# Patient Record
Sex: Female | Born: 1950
Health system: Southern US, Community
[De-identification: ages and names within clinical notes are randomized; demographics above are authoritative.]

## PROBLEM LIST (undated history)

## (undated) DIAGNOSIS — I10 Essential (primary) hypertension: Secondary | ICD-10-CM

## (undated) DIAGNOSIS — R55 Syncope and collapse: Secondary | ICD-10-CM

## (undated) DIAGNOSIS — E7800 Pure hypercholesterolemia, unspecified: Secondary | ICD-10-CM

## (undated) DIAGNOSIS — R9431 Abnormal electrocardiogram [ECG] [EKG]: Secondary | ICD-10-CM

## (undated) HISTORY — PX: TOE SURGERY: SHX1073

## (undated) HISTORY — DX: Essential (primary) hypertension: I10

## (undated) HISTORY — DX: Pure hypercholesterolemia, unspecified: E78.00

## (undated) HISTORY — DX: Syncope and collapse: R55

## (undated) HISTORY — PX: ANKLE SURGERY: SHX546

## (undated) HISTORY — DX: Abnormal electrocardiogram (ECG) (EKG): R94.31

---

## 2018-08-07 DIAGNOSIS — E785 Hyperlipidemia, unspecified: Secondary | ICD-10-CM | POA: Diagnosis not present

## 2018-08-07 DIAGNOSIS — I10 Essential (primary) hypertension: Secondary | ICD-10-CM | POA: Diagnosis not present

## 2018-08-14 DIAGNOSIS — R7301 Impaired fasting glucose: Secondary | ICD-10-CM | POA: Diagnosis not present

## 2018-08-14 DIAGNOSIS — R358 Other polyuria: Secondary | ICD-10-CM | POA: Diagnosis not present

## 2018-08-14 DIAGNOSIS — I10 Essential (primary) hypertension: Secondary | ICD-10-CM | POA: Diagnosis not present

## 2018-08-14 DIAGNOSIS — F329 Major depressive disorder, single episode, unspecified: Secondary | ICD-10-CM | POA: Diagnosis not present

## 2018-08-14 DIAGNOSIS — E785 Hyperlipidemia, unspecified: Secondary | ICD-10-CM | POA: Diagnosis not present

## 2018-12-23 DIAGNOSIS — Z6830 Body mass index (BMI) 30.0-30.9, adult: Secondary | ICD-10-CM | POA: Diagnosis not present

## 2018-12-23 DIAGNOSIS — I10 Essential (primary) hypertension: Secondary | ICD-10-CM | POA: Diagnosis not present

## 2018-12-23 DIAGNOSIS — E785 Hyperlipidemia, unspecified: Secondary | ICD-10-CM | POA: Diagnosis not present

## 2018-12-23 DIAGNOSIS — F329 Major depressive disorder, single episode, unspecified: Secondary | ICD-10-CM | POA: Diagnosis not present

## 2019-01-13 DIAGNOSIS — Z1211 Encounter for screening for malignant neoplasm of colon: Secondary | ICD-10-CM | POA: Diagnosis not present

## 2019-01-13 DIAGNOSIS — G8929 Other chronic pain: Secondary | ICD-10-CM | POA: Diagnosis not present

## 2019-01-13 DIAGNOSIS — M542 Cervicalgia: Secondary | ICD-10-CM | POA: Diagnosis not present

## 2019-01-13 DIAGNOSIS — M47816 Spondylosis without myelopathy or radiculopathy, lumbar region: Secondary | ICD-10-CM | POA: Diagnosis not present

## 2019-01-13 DIAGNOSIS — M549 Dorsalgia, unspecified: Secondary | ICD-10-CM | POA: Diagnosis not present

## 2019-01-13 DIAGNOSIS — M47812 Spondylosis without myelopathy or radiculopathy, cervical region: Secondary | ICD-10-CM | POA: Diagnosis not present

## 2019-01-24 DIAGNOSIS — Z6830 Body mass index (BMI) 30.0-30.9, adult: Secondary | ICD-10-CM | POA: Diagnosis not present

## 2019-01-24 DIAGNOSIS — I6522 Occlusion and stenosis of left carotid artery: Secondary | ICD-10-CM | POA: Diagnosis not present

## 2019-01-24 DIAGNOSIS — M4712 Other spondylosis with myelopathy, cervical region: Secondary | ICD-10-CM | POA: Diagnosis not present

## 2019-02-13 DIAGNOSIS — I708 Atherosclerosis of other arteries: Secondary | ICD-10-CM | POA: Diagnosis not present

## 2019-02-13 DIAGNOSIS — M542 Cervicalgia: Secondary | ICD-10-CM | POA: Diagnosis not present

## 2019-02-13 DIAGNOSIS — I6523 Occlusion and stenosis of bilateral carotid arteries: Secondary | ICD-10-CM | POA: Diagnosis not present

## 2019-02-21 DIAGNOSIS — M47812 Spondylosis without myelopathy or radiculopathy, cervical region: Secondary | ICD-10-CM | POA: Diagnosis not present

## 2019-02-21 DIAGNOSIS — G8929 Other chronic pain: Secondary | ICD-10-CM | POA: Diagnosis not present

## 2019-02-21 DIAGNOSIS — M542 Cervicalgia: Secondary | ICD-10-CM | POA: Diagnosis not present

## 2019-02-21 DIAGNOSIS — M4692 Unspecified inflammatory spondylopathy, cervical region: Secondary | ICD-10-CM | POA: Diagnosis not present

## 2019-02-21 DIAGNOSIS — M4802 Spinal stenosis, cervical region: Secondary | ICD-10-CM | POA: Diagnosis not present

## 2019-03-13 DIAGNOSIS — R51 Headache: Secondary | ICD-10-CM | POA: Diagnosis not present

## 2019-03-13 DIAGNOSIS — M542 Cervicalgia: Secondary | ICD-10-CM | POA: Diagnosis not present

## 2019-04-23 DIAGNOSIS — G8929 Other chronic pain: Secondary | ICD-10-CM | POA: Diagnosis not present

## 2019-04-23 DIAGNOSIS — Z23 Encounter for immunization: Secondary | ICD-10-CM | POA: Diagnosis not present

## 2019-04-23 DIAGNOSIS — M549 Dorsalgia, unspecified: Secondary | ICD-10-CM | POA: Diagnosis not present

## 2019-04-23 DIAGNOSIS — Z6830 Body mass index (BMI) 30.0-30.9, adult: Secondary | ICD-10-CM | POA: Diagnosis not present

## 2019-05-14 DIAGNOSIS — G8929 Other chronic pain: Secondary | ICD-10-CM | POA: Diagnosis not present

## 2019-05-14 DIAGNOSIS — M47812 Spondylosis without myelopathy or radiculopathy, cervical region: Secondary | ICD-10-CM | POA: Diagnosis not present

## 2019-05-14 DIAGNOSIS — M542 Cervicalgia: Secondary | ICD-10-CM | POA: Diagnosis not present

## 2019-07-16 DIAGNOSIS — I10 Essential (primary) hypertension: Secondary | ICD-10-CM | POA: Diagnosis not present

## 2019-07-16 DIAGNOSIS — E785 Hyperlipidemia, unspecified: Secondary | ICD-10-CM | POA: Diagnosis not present

## 2019-07-25 DIAGNOSIS — E785 Hyperlipidemia, unspecified: Secondary | ICD-10-CM | POA: Diagnosis not present

## 2019-07-25 DIAGNOSIS — I10 Essential (primary) hypertension: Secondary | ICD-10-CM | POA: Diagnosis not present

## 2019-07-25 DIAGNOSIS — F329 Major depressive disorder, single episode, unspecified: Secondary | ICD-10-CM | POA: Diagnosis not present

## 2019-07-25 DIAGNOSIS — E669 Obesity, unspecified: Secondary | ICD-10-CM | POA: Diagnosis not present

## 2019-12-30 DIAGNOSIS — Z1339 Encounter for screening examination for other mental health and behavioral disorders: Secondary | ICD-10-CM | POA: Diagnosis not present

## 2019-12-30 DIAGNOSIS — Z0000 Encounter for general adult medical examination without abnormal findings: Secondary | ICD-10-CM | POA: Diagnosis not present

## 2019-12-30 DIAGNOSIS — Z1331 Encounter for screening for depression: Secondary | ICD-10-CM | POA: Diagnosis not present

## 2019-12-30 DIAGNOSIS — I10 Essential (primary) hypertension: Secondary | ICD-10-CM | POA: Diagnosis not present

## 2019-12-30 DIAGNOSIS — Z139 Encounter for screening, unspecified: Secondary | ICD-10-CM | POA: Diagnosis not present

## 2019-12-30 DIAGNOSIS — Z136 Encounter for screening for cardiovascular disorders: Secondary | ICD-10-CM | POA: Diagnosis not present

## 2019-12-30 DIAGNOSIS — E785 Hyperlipidemia, unspecified: Secondary | ICD-10-CM | POA: Diagnosis not present

## 2019-12-31 DIAGNOSIS — Z6831 Body mass index (BMI) 31.0-31.9, adult: Secondary | ICD-10-CM | POA: Diagnosis not present

## 2019-12-31 DIAGNOSIS — M542 Cervicalgia: Secondary | ICD-10-CM | POA: Diagnosis not present

## 2019-12-31 DIAGNOSIS — G8929 Other chronic pain: Secondary | ICD-10-CM | POA: Diagnosis not present

## 2020-01-09 DIAGNOSIS — I10 Essential (primary) hypertension: Secondary | ICD-10-CM | POA: Diagnosis not present

## 2020-01-09 DIAGNOSIS — F329 Major depressive disorder, single episode, unspecified: Secondary | ICD-10-CM | POA: Diagnosis not present

## 2020-01-09 DIAGNOSIS — E785 Hyperlipidemia, unspecified: Secondary | ICD-10-CM | POA: Diagnosis not present

## 2020-01-09 DIAGNOSIS — R7301 Impaired fasting glucose: Secondary | ICD-10-CM | POA: Diagnosis not present

## 2020-03-24 DIAGNOSIS — Z23 Encounter for immunization: Secondary | ICD-10-CM | POA: Diagnosis not present

## 2020-07-14 DIAGNOSIS — E785 Hyperlipidemia, unspecified: Secondary | ICD-10-CM | POA: Diagnosis not present

## 2020-07-14 DIAGNOSIS — R7301 Impaired fasting glucose: Secondary | ICD-10-CM | POA: Diagnosis not present

## 2020-07-21 DIAGNOSIS — Z6830 Body mass index (BMI) 30.0-30.9, adult: Secondary | ICD-10-CM | POA: Diagnosis not present

## 2020-07-21 DIAGNOSIS — I10 Essential (primary) hypertension: Secondary | ICD-10-CM | POA: Diagnosis not present

## 2020-07-21 DIAGNOSIS — E785 Hyperlipidemia, unspecified: Secondary | ICD-10-CM | POA: Diagnosis not present

## 2020-07-21 DIAGNOSIS — R7301 Impaired fasting glucose: Secondary | ICD-10-CM | POA: Diagnosis not present

## 2020-11-03 DIAGNOSIS — R7301 Impaired fasting glucose: Secondary | ICD-10-CM | POA: Diagnosis not present

## 2020-11-03 DIAGNOSIS — E785 Hyperlipidemia, unspecified: Secondary | ICD-10-CM | POA: Diagnosis not present

## 2020-11-10 DIAGNOSIS — R7303 Prediabetes: Secondary | ICD-10-CM | POA: Diagnosis not present

## 2020-11-10 DIAGNOSIS — Z6830 Body mass index (BMI) 30.0-30.9, adult: Secondary | ICD-10-CM | POA: Diagnosis not present

## 2020-11-10 DIAGNOSIS — I10 Essential (primary) hypertension: Secondary | ICD-10-CM | POA: Diagnosis not present

## 2020-11-10 DIAGNOSIS — E785 Hyperlipidemia, unspecified: Secondary | ICD-10-CM | POA: Diagnosis not present

## 2020-12-22 DIAGNOSIS — Z79899 Other long term (current) drug therapy: Secondary | ICD-10-CM | POA: Diagnosis not present

## 2020-12-22 DIAGNOSIS — M150 Primary generalized (osteo)arthritis: Secondary | ICD-10-CM | POA: Diagnosis not present

## 2020-12-22 DIAGNOSIS — E782 Mixed hyperlipidemia: Secondary | ICD-10-CM | POA: Diagnosis not present

## 2020-12-22 DIAGNOSIS — F411 Generalized anxiety disorder: Secondary | ICD-10-CM | POA: Diagnosis not present

## 2020-12-22 DIAGNOSIS — J069 Acute upper respiratory infection, unspecified: Secondary | ICD-10-CM | POA: Diagnosis not present

## 2020-12-22 DIAGNOSIS — F331 Major depressive disorder, recurrent, moderate: Secondary | ICD-10-CM | POA: Diagnosis not present

## 2020-12-22 DIAGNOSIS — I10 Essential (primary) hypertension: Secondary | ICD-10-CM | POA: Diagnosis not present

## 2020-12-23 DIAGNOSIS — M150 Primary generalized (osteo)arthritis: Secondary | ICD-10-CM | POA: Diagnosis not present

## 2020-12-23 DIAGNOSIS — E782 Mixed hyperlipidemia: Secondary | ICD-10-CM | POA: Diagnosis not present

## 2020-12-23 DIAGNOSIS — I10 Essential (primary) hypertension: Secondary | ICD-10-CM | POA: Diagnosis not present

## 2021-01-13 DIAGNOSIS — I10 Essential (primary) hypertension: Secondary | ICD-10-CM | POA: Diagnosis not present

## 2021-01-13 DIAGNOSIS — E782 Mixed hyperlipidemia: Secondary | ICD-10-CM | POA: Diagnosis not present

## 2021-01-13 DIAGNOSIS — M150 Primary generalized (osteo)arthritis: Secondary | ICD-10-CM | POA: Diagnosis not present

## 2021-01-17 DIAGNOSIS — M150 Primary generalized (osteo)arthritis: Secondary | ICD-10-CM | POA: Diagnosis not present

## 2021-01-17 DIAGNOSIS — I10 Essential (primary) hypertension: Secondary | ICD-10-CM | POA: Diagnosis not present

## 2021-01-17 DIAGNOSIS — F411 Generalized anxiety disorder: Secondary | ICD-10-CM | POA: Diagnosis not present

## 2021-02-16 DIAGNOSIS — F411 Generalized anxiety disorder: Secondary | ICD-10-CM | POA: Diagnosis not present

## 2021-02-16 DIAGNOSIS — E1165 Type 2 diabetes mellitus with hyperglycemia: Secondary | ICD-10-CM | POA: Diagnosis not present

## 2021-02-16 DIAGNOSIS — E782 Mixed hyperlipidemia: Secondary | ICD-10-CM | POA: Diagnosis not present

## 2021-02-16 DIAGNOSIS — I10 Essential (primary) hypertension: Secondary | ICD-10-CM | POA: Diagnosis not present

## 2021-02-16 DIAGNOSIS — R011 Cardiac murmur, unspecified: Secondary | ICD-10-CM | POA: Diagnosis not present

## 2021-02-16 DIAGNOSIS — Z79899 Other long term (current) drug therapy: Secondary | ICD-10-CM | POA: Diagnosis not present

## 2021-02-16 DIAGNOSIS — D518 Other vitamin B12 deficiency anemias: Secondary | ICD-10-CM | POA: Diagnosis not present

## 2021-02-16 DIAGNOSIS — E559 Vitamin D deficiency, unspecified: Secondary | ICD-10-CM | POA: Diagnosis not present

## 2021-02-16 DIAGNOSIS — M150 Primary generalized (osteo)arthritis: Secondary | ICD-10-CM | POA: Diagnosis not present

## 2021-02-16 DIAGNOSIS — Z0000 Encounter for general adult medical examination without abnormal findings: Secondary | ICD-10-CM | POA: Diagnosis not present

## 2021-02-16 DIAGNOSIS — E038 Other specified hypothyroidism: Secondary | ICD-10-CM | POA: Diagnosis not present

## 2021-02-18 DIAGNOSIS — I6523 Occlusion and stenosis of bilateral carotid arteries: Secondary | ICD-10-CM | POA: Diagnosis not present

## 2021-02-18 DIAGNOSIS — R0989 Other specified symptoms and signs involving the circulatory and respiratory systems: Secondary | ICD-10-CM | POA: Diagnosis not present

## 2021-02-22 DIAGNOSIS — E1165 Type 2 diabetes mellitus with hyperglycemia: Secondary | ICD-10-CM | POA: Diagnosis not present

## 2021-02-22 DIAGNOSIS — M150 Primary generalized (osteo)arthritis: Secondary | ICD-10-CM | POA: Diagnosis not present

## 2021-02-22 DIAGNOSIS — I10 Essential (primary) hypertension: Secondary | ICD-10-CM | POA: Diagnosis not present

## 2021-02-22 DIAGNOSIS — E782 Mixed hyperlipidemia: Secondary | ICD-10-CM | POA: Diagnosis not present

## 2021-02-22 DIAGNOSIS — D518 Other vitamin B12 deficiency anemias: Secondary | ICD-10-CM | POA: Diagnosis not present

## 2021-02-22 DIAGNOSIS — E559 Vitamin D deficiency, unspecified: Secondary | ICD-10-CM | POA: Diagnosis not present

## 2021-02-22 DIAGNOSIS — E038 Other specified hypothyroidism: Secondary | ICD-10-CM | POA: Diagnosis not present

## 2021-02-24 DIAGNOSIS — R590 Localized enlarged lymph nodes: Secondary | ICD-10-CM | POA: Diagnosis not present

## 2021-02-25 DIAGNOSIS — R229 Localized swelling, mass and lump, unspecified: Secondary | ICD-10-CM | POA: Diagnosis not present

## 2021-02-25 DIAGNOSIS — R2232 Localized swelling, mass and lump, left upper limb: Secondary | ICD-10-CM | POA: Diagnosis not present

## 2021-02-25 DIAGNOSIS — D492 Neoplasm of unspecified behavior of bone, soft tissue, and skin: Secondary | ICD-10-CM | POA: Diagnosis not present

## 2021-02-25 DIAGNOSIS — M79642 Pain in left hand: Secondary | ICD-10-CM | POA: Diagnosis not present

## 2021-03-01 DIAGNOSIS — R1310 Dysphagia, unspecified: Secondary | ICD-10-CM | POA: Diagnosis not present

## 2021-03-01 DIAGNOSIS — R221 Localized swelling, mass and lump, neck: Secondary | ICD-10-CM | POA: Diagnosis not present

## 2021-03-04 DIAGNOSIS — I77811 Abdominal aortic ectasia: Secondary | ICD-10-CM | POA: Diagnosis not present

## 2021-03-07 DIAGNOSIS — M150 Primary generalized (osteo)arthritis: Secondary | ICD-10-CM | POA: Diagnosis not present

## 2021-03-08 DIAGNOSIS — D518 Other vitamin B12 deficiency anemias: Secondary | ICD-10-CM | POA: Diagnosis not present

## 2021-03-08 DIAGNOSIS — M542 Cervicalgia: Secondary | ICD-10-CM | POA: Diagnosis not present

## 2021-03-16 DIAGNOSIS — M47812 Spondylosis without myelopathy or radiculopathy, cervical region: Secondary | ICD-10-CM | POA: Diagnosis not present

## 2021-03-16 DIAGNOSIS — M50221 Other cervical disc displacement at C4-C5 level: Secondary | ICD-10-CM | POA: Diagnosis not present

## 2021-03-16 DIAGNOSIS — M4803 Spinal stenosis, cervicothoracic region: Secondary | ICD-10-CM | POA: Diagnosis not present

## 2021-03-16 DIAGNOSIS — M542 Cervicalgia: Secondary | ICD-10-CM | POA: Diagnosis not present

## 2021-03-16 DIAGNOSIS — M50223 Other cervical disc displacement at C6-C7 level: Secondary | ICD-10-CM | POA: Diagnosis not present

## 2021-03-16 DIAGNOSIS — M4802 Spinal stenosis, cervical region: Secondary | ICD-10-CM | POA: Diagnosis not present

## 2021-03-16 DIAGNOSIS — S22009A Unspecified fracture of unspecified thoracic vertebra, initial encounter for closed fracture: Secondary | ICD-10-CM | POA: Diagnosis not present

## 2021-03-18 DIAGNOSIS — E782 Mixed hyperlipidemia: Secondary | ICD-10-CM | POA: Diagnosis not present

## 2021-03-18 DIAGNOSIS — M150 Primary generalized (osteo)arthritis: Secondary | ICD-10-CM | POA: Diagnosis not present

## 2021-03-18 DIAGNOSIS — I10 Essential (primary) hypertension: Secondary | ICD-10-CM | POA: Diagnosis not present

## 2021-03-18 DIAGNOSIS — E038 Other specified hypothyroidism: Secondary | ICD-10-CM | POA: Diagnosis not present

## 2021-03-18 DIAGNOSIS — E559 Vitamin D deficiency, unspecified: Secondary | ICD-10-CM | POA: Diagnosis not present

## 2021-03-18 DIAGNOSIS — E1165 Type 2 diabetes mellitus with hyperglycemia: Secondary | ICD-10-CM | POA: Diagnosis not present

## 2021-03-18 DIAGNOSIS — D518 Other vitamin B12 deficiency anemias: Secondary | ICD-10-CM | POA: Diagnosis not present

## 2021-03-25 DIAGNOSIS — M150 Primary generalized (osteo)arthritis: Secondary | ICD-10-CM | POA: Diagnosis not present

## 2021-03-30 DIAGNOSIS — M150 Primary generalized (osteo)arthritis: Secondary | ICD-10-CM | POA: Diagnosis not present

## 2021-03-30 DIAGNOSIS — E1165 Type 2 diabetes mellitus with hyperglycemia: Secondary | ICD-10-CM | POA: Diagnosis not present

## 2021-03-30 DIAGNOSIS — E782 Mixed hyperlipidemia: Secondary | ICD-10-CM | POA: Diagnosis not present

## 2021-03-30 DIAGNOSIS — I10 Essential (primary) hypertension: Secondary | ICD-10-CM | POA: Diagnosis not present

## 2021-03-30 DIAGNOSIS — E038 Other specified hypothyroidism: Secondary | ICD-10-CM | POA: Diagnosis not present

## 2021-03-30 DIAGNOSIS — Z23 Encounter for immunization: Secondary | ICD-10-CM | POA: Diagnosis not present

## 2021-04-19 DIAGNOSIS — M542 Cervicalgia: Secondary | ICD-10-CM | POA: Diagnosis not present

## 2021-04-25 DIAGNOSIS — F331 Major depressive disorder, recurrent, moderate: Secondary | ICD-10-CM | POA: Diagnosis not present

## 2021-04-25 DIAGNOSIS — E038 Other specified hypothyroidism: Secondary | ICD-10-CM | POA: Diagnosis not present

## 2021-04-25 DIAGNOSIS — E1165 Type 2 diabetes mellitus with hyperglycemia: Secondary | ICD-10-CM | POA: Diagnosis not present

## 2021-04-25 DIAGNOSIS — M150 Primary generalized (osteo)arthritis: Secondary | ICD-10-CM | POA: Diagnosis not present

## 2021-04-25 DIAGNOSIS — E559 Vitamin D deficiency, unspecified: Secondary | ICD-10-CM | POA: Diagnosis not present

## 2021-04-25 DIAGNOSIS — I10 Essential (primary) hypertension: Secondary | ICD-10-CM | POA: Diagnosis not present

## 2021-04-25 DIAGNOSIS — D518 Other vitamin B12 deficiency anemias: Secondary | ICD-10-CM | POA: Diagnosis not present

## 2021-04-25 DIAGNOSIS — E782 Mixed hyperlipidemia: Secondary | ICD-10-CM | POA: Diagnosis not present

## 2021-04-27 DIAGNOSIS — D518 Other vitamin B12 deficiency anemias: Secondary | ICD-10-CM | POA: Diagnosis not present

## 2021-05-03 DIAGNOSIS — M542 Cervicalgia: Secondary | ICD-10-CM | POA: Diagnosis not present

## 2021-05-11 DIAGNOSIS — D518 Other vitamin B12 deficiency anemias: Secondary | ICD-10-CM | POA: Diagnosis not present

## 2021-05-11 DIAGNOSIS — I10 Essential (primary) hypertension: Secondary | ICD-10-CM | POA: Diagnosis not present

## 2021-05-11 DIAGNOSIS — E038 Other specified hypothyroidism: Secondary | ICD-10-CM | POA: Diagnosis not present

## 2021-05-11 DIAGNOSIS — Z79899 Other long term (current) drug therapy: Secondary | ICD-10-CM | POA: Diagnosis not present

## 2021-05-11 DIAGNOSIS — E559 Vitamin D deficiency, unspecified: Secondary | ICD-10-CM | POA: Diagnosis not present

## 2021-05-11 DIAGNOSIS — E782 Mixed hyperlipidemia: Secondary | ICD-10-CM | POA: Diagnosis not present

## 2021-05-11 DIAGNOSIS — F331 Major depressive disorder, recurrent, moderate: Secondary | ICD-10-CM | POA: Diagnosis not present

## 2021-05-11 DIAGNOSIS — E1165 Type 2 diabetes mellitus with hyperglycemia: Secondary | ICD-10-CM | POA: Diagnosis not present

## 2021-05-11 DIAGNOSIS — M150 Primary generalized (osteo)arthritis: Secondary | ICD-10-CM | POA: Diagnosis not present

## 2021-05-23 DIAGNOSIS — E559 Vitamin D deficiency, unspecified: Secondary | ICD-10-CM | POA: Diagnosis not present

## 2021-05-23 DIAGNOSIS — D518 Other vitamin B12 deficiency anemias: Secondary | ICD-10-CM | POA: Diagnosis not present

## 2021-05-23 DIAGNOSIS — E782 Mixed hyperlipidemia: Secondary | ICD-10-CM | POA: Diagnosis not present

## 2021-05-24 DIAGNOSIS — F331 Major depressive disorder, recurrent, moderate: Secondary | ICD-10-CM | POA: Diagnosis not present

## 2021-05-25 DIAGNOSIS — J449 Chronic obstructive pulmonary disease, unspecified: Secondary | ICD-10-CM | POA: Diagnosis not present

## 2021-05-25 DIAGNOSIS — F331 Major depressive disorder, recurrent, moderate: Secondary | ICD-10-CM | POA: Diagnosis not present

## 2021-05-25 DIAGNOSIS — E1165 Type 2 diabetes mellitus with hyperglycemia: Secondary | ICD-10-CM | POA: Diagnosis not present

## 2021-05-25 DIAGNOSIS — E038 Other specified hypothyroidism: Secondary | ICD-10-CM | POA: Diagnosis not present

## 2021-05-25 DIAGNOSIS — E782 Mixed hyperlipidemia: Secondary | ICD-10-CM | POA: Diagnosis not present

## 2021-05-25 DIAGNOSIS — M150 Primary generalized (osteo)arthritis: Secondary | ICD-10-CM | POA: Diagnosis not present

## 2021-05-25 DIAGNOSIS — I10 Essential (primary) hypertension: Secondary | ICD-10-CM | POA: Diagnosis not present

## 2021-05-25 DIAGNOSIS — D518 Other vitamin B12 deficiency anemias: Secondary | ICD-10-CM | POA: Diagnosis not present

## 2021-05-25 DIAGNOSIS — E559 Vitamin D deficiency, unspecified: Secondary | ICD-10-CM | POA: Diagnosis not present

## 2021-09-13 ENCOUNTER — Other Ambulatory Visit: Payer: Self-pay | Admitting: Podiatry

## 2021-09-13 DIAGNOSIS — R2242 Localized swelling, mass and lump, left lower limb: Secondary | ICD-10-CM

## 2021-09-13 DIAGNOSIS — M79672 Pain in left foot: Secondary | ICD-10-CM

## 2021-10-18 ENCOUNTER — Other Ambulatory Visit: Payer: Self-pay

## 2021-11-17 ENCOUNTER — Other Ambulatory Visit: Payer: Self-pay

## 2021-11-23 ENCOUNTER — Ambulatory Visit
Admission: RE | Admit: 2021-11-23 | Discharge: 2021-11-23 | Disposition: A | Discharge status: Home / Self Care | Payer: Medicare HMO | Source: Ambulatory Visit | Attending: Podiatry | Admitting: Podiatry

## 2021-11-23 DIAGNOSIS — M79672 Pain in left foot: Secondary | ICD-10-CM

## 2021-11-23 DIAGNOSIS — R2242 Localized swelling, mass and lump, left lower limb: Secondary | ICD-10-CM

## 2021-11-23 MED ORDER — GADOBENATE DIMEGLUMINE 529 MG/ML IV SOLN
13.0000 mL | Freq: Once | INTRAVENOUS | Status: AC | PRN
  Administered 2021-11-23: 13 mL via INTRAVENOUS

## 2022-06-21 DIAGNOSIS — D518 Other vitamin B12 deficiency anemias: Secondary | ICD-10-CM | POA: Diagnosis not present

## 2022-06-21 DIAGNOSIS — E038 Other specified hypothyroidism: Secondary | ICD-10-CM | POA: Diagnosis not present

## 2022-06-21 DIAGNOSIS — I10 Essential (primary) hypertension: Secondary | ICD-10-CM | POA: Diagnosis not present

## 2022-06-21 DIAGNOSIS — J449 Chronic obstructive pulmonary disease, unspecified: Secondary | ICD-10-CM | POA: Diagnosis not present

## 2022-06-21 DIAGNOSIS — E782 Mixed hyperlipidemia: Secondary | ICD-10-CM | POA: Diagnosis not present

## 2022-06-21 DIAGNOSIS — M150 Primary generalized (osteo)arthritis: Secondary | ICD-10-CM | POA: Diagnosis not present

## 2022-06-21 DIAGNOSIS — E559 Vitamin D deficiency, unspecified: Secondary | ICD-10-CM | POA: Diagnosis not present

## 2022-06-21 DIAGNOSIS — F331 Major depressive disorder, recurrent, moderate: Secondary | ICD-10-CM | POA: Diagnosis not present

## 2022-06-21 DIAGNOSIS — E1165 Type 2 diabetes mellitus with hyperglycemia: Secondary | ICD-10-CM | POA: Diagnosis not present

## 2022-06-24 DIAGNOSIS — I10 Essential (primary) hypertension: Secondary | ICD-10-CM | POA: Diagnosis not present

## 2022-07-11 DIAGNOSIS — D518 Other vitamin B12 deficiency anemias: Secondary | ICD-10-CM | POA: Diagnosis not present

## 2022-07-11 DIAGNOSIS — E038 Other specified hypothyroidism: Secondary | ICD-10-CM | POA: Diagnosis not present

## 2022-07-11 DIAGNOSIS — J449 Chronic obstructive pulmonary disease, unspecified: Secondary | ICD-10-CM | POA: Diagnosis not present

## 2022-07-11 DIAGNOSIS — E559 Vitamin D deficiency, unspecified: Secondary | ICD-10-CM | POA: Diagnosis not present

## 2022-07-11 DIAGNOSIS — M150 Primary generalized (osteo)arthritis: Secondary | ICD-10-CM | POA: Diagnosis not present

## 2022-07-11 DIAGNOSIS — E1165 Type 2 diabetes mellitus with hyperglycemia: Secondary | ICD-10-CM | POA: Diagnosis not present

## 2022-07-11 DIAGNOSIS — E782 Mixed hyperlipidemia: Secondary | ICD-10-CM | POA: Diagnosis not present

## 2022-07-11 DIAGNOSIS — I10 Essential (primary) hypertension: Secondary | ICD-10-CM | POA: Diagnosis not present

## 2022-07-11 DIAGNOSIS — F331 Major depressive disorder, recurrent, moderate: Secondary | ICD-10-CM | POA: Diagnosis not present

## 2022-07-17 DIAGNOSIS — U071 COVID-19: Secondary | ICD-10-CM | POA: Diagnosis not present

## 2022-07-17 DIAGNOSIS — Z20828 Contact with and (suspected) exposure to other viral communicable diseases: Secondary | ICD-10-CM | POA: Diagnosis not present

## 2022-07-17 DIAGNOSIS — J069 Acute upper respiratory infection, unspecified: Secondary | ICD-10-CM | POA: Diagnosis not present

## 2022-07-18 DIAGNOSIS — E7800 Pure hypercholesterolemia, unspecified: Secondary | ICD-10-CM | POA: Diagnosis not present

## 2022-07-18 DIAGNOSIS — R059 Cough, unspecified: Secondary | ICD-10-CM | POA: Diagnosis not present

## 2022-07-18 DIAGNOSIS — R000 Tachycardia, unspecified: Secondary | ICD-10-CM | POA: Diagnosis not present

## 2022-07-18 DIAGNOSIS — J205 Acute bronchitis due to respiratory syncytial virus: Secondary | ICD-10-CM | POA: Diagnosis not present

## 2022-07-18 DIAGNOSIS — R9431 Abnormal electrocardiogram [ECG] [EKG]: Secondary | ICD-10-CM | POA: Diagnosis not present

## 2022-07-18 DIAGNOSIS — I10 Essential (primary) hypertension: Secondary | ICD-10-CM | POA: Diagnosis not present

## 2022-07-25 DIAGNOSIS — I10 Essential (primary) hypertension: Secondary | ICD-10-CM | POA: Diagnosis not present

## 2022-07-29 DIAGNOSIS — R1110 Vomiting, unspecified: Secondary | ICD-10-CM | POA: Diagnosis not present

## 2022-07-29 DIAGNOSIS — R5383 Other fatigue: Secondary | ICD-10-CM | POA: Diagnosis not present

## 2022-07-29 DIAGNOSIS — R531 Weakness: Secondary | ICD-10-CM | POA: Diagnosis not present

## 2022-07-29 DIAGNOSIS — R55 Syncope and collapse: Secondary | ICD-10-CM | POA: Diagnosis not present

## 2022-07-29 DIAGNOSIS — Z20822 Contact with and (suspected) exposure to covid-19: Secondary | ICD-10-CM | POA: Diagnosis not present

## 2022-07-29 DIAGNOSIS — F17210 Nicotine dependence, cigarettes, uncomplicated: Secondary | ICD-10-CM | POA: Diagnosis not present

## 2022-07-29 DIAGNOSIS — R630 Anorexia: Secondary | ICD-10-CM | POA: Diagnosis not present

## 2022-07-29 DIAGNOSIS — R9431 Abnormal electrocardiogram [ECG] [EKG]: Secondary | ICD-10-CM | POA: Diagnosis not present

## 2022-07-29 DIAGNOSIS — R112 Nausea with vomiting, unspecified: Secondary | ICD-10-CM | POA: Diagnosis not present

## 2022-07-29 DIAGNOSIS — Z79899 Other long term (current) drug therapy: Secondary | ICD-10-CM | POA: Diagnosis not present

## 2022-07-30 DIAGNOSIS — R9431 Abnormal electrocardiogram [ECG] [EKG]: Secondary | ICD-10-CM | POA: Diagnosis not present

## 2022-07-30 DIAGNOSIS — I499 Cardiac arrhythmia, unspecified: Secondary | ICD-10-CM | POA: Diagnosis not present

## 2022-07-30 DIAGNOSIS — R55 Syncope and collapse: Secondary | ICD-10-CM | POA: Diagnosis not present

## 2022-07-30 DIAGNOSIS — I3139 Other pericardial effusion (noninflammatory): Secondary | ICD-10-CM | POA: Diagnosis not present

## 2022-07-30 DIAGNOSIS — R112 Nausea with vomiting, unspecified: Secondary | ICD-10-CM | POA: Diagnosis not present

## 2022-08-02 DIAGNOSIS — Z79899 Other long term (current) drug therapy: Secondary | ICD-10-CM | POA: Diagnosis not present

## 2022-08-02 DIAGNOSIS — E038 Other specified hypothyroidism: Secondary | ICD-10-CM | POA: Diagnosis not present

## 2022-08-02 DIAGNOSIS — E559 Vitamin D deficiency, unspecified: Secondary | ICD-10-CM | POA: Diagnosis not present

## 2022-08-02 DIAGNOSIS — D518 Other vitamin B12 deficiency anemias: Secondary | ICD-10-CM | POA: Diagnosis not present

## 2022-08-02 DIAGNOSIS — E782 Mixed hyperlipidemia: Secondary | ICD-10-CM | POA: Diagnosis not present

## 2022-08-02 DIAGNOSIS — F331 Major depressive disorder, recurrent, moderate: Secondary | ICD-10-CM | POA: Diagnosis not present

## 2022-08-02 DIAGNOSIS — I10 Essential (primary) hypertension: Secondary | ICD-10-CM | POA: Diagnosis not present

## 2022-08-02 DIAGNOSIS — E1165 Type 2 diabetes mellitus with hyperglycemia: Secondary | ICD-10-CM | POA: Diagnosis not present

## 2022-08-02 DIAGNOSIS — M150 Primary generalized (osteo)arthritis: Secondary | ICD-10-CM | POA: Diagnosis not present

## 2022-08-08 NOTE — Progress Notes (Unsigned)
  Cardiology Office Note:    Date:  08/08/2022   ID:  Brenda Henry, DOB 1950/09/20, MRN [id number]  PCP:  Bonnita Nasuti, MD  Cardiologist:  Shirlee More, MD   Referring MD: Ronita Hipps, MD  ASSESSMENT:    No diagnosis found. PLAN:    In order of problems listed above:  ***  Next appointment   Medication Adjustments/Labs and Tests Ordered: Current medicines are reviewed at length with the patient today.  Concerns regarding medicines are outlined above.  No orders of the defined types were placed in this encounter.  No orders of the defined types were placed in this encounter.    No chief complaint on file. ***  History of Present Illness:    Brenda Henry is a 72 y.o. female who is being seen today for the evaluation of *** at the request of Ronita Hipps, MD.   No past medical history on file.  *** The histories are not reviewed yet. Please review them in the "History" navigator section and refresh this [place].  Current Medications: No outpatient medications have been marked as taking for the 08/09/22 encounter (Appointment) with Richardo Priest, MD.     Allergies:   Patient has no allergy information on record.   Social History   Socioeconomic History   Marital status: Single    Spouse name: Not on file   Number of children: Not on file   Years of education: Not on file   Highest education level: Not on file  Occupational History   Not on file  Tobacco Use   Smoking status: Not on file   Smokeless tobacco: Not on file  Substance and Sexual Activity   Alcohol use: Not on file   Drug use: Not on file   Sexual activity: Not on file  Other Topics Concern   Not on file  Social History Narrative   Not on file   Social Determinants of Health   Financial Resource Strain: Not on file  Food Insecurity: Not on file  Transportation Needs: Not on file  Physical Activity: Not on file  Stress: Not on file  Social Connections: Not on file      Family History: The patient's ***family history is not on file.  ROS:   ROS Please see the history of present illness.    *** All other systems reviewed and are negative.  EKGs/Labs/Other Studies Reviewed:    The following studies were reviewed today: ***  EKG:  EKG is *** ordered today.  The ekg ordered today is personally reviewed and demonstrates ***  Recent Labs: No results found for requested labs within last 365 days.  Recent Lipid Panel No results found for: "CHOL", "TRIG", "HDL", "CHOLHDL", "VLDL", "LDLCALC", "LDLDIRECT"  Physical Exam:    VS:  There were no vitals taken for this visit.    Wt Readings from Last 3 Encounters:  No data found for Wt     GEN: *** Well nourished, well developed in no acute distress HEENT: Normal NECK: No JVD; No carotid bruits LYMPHATICS: No lymphadenopathy CARDIAC: ***RRR, no murmurs, rubs, gallops RESPIRATORY:  Clear to auscultation without rales, wheezing or rhonchi  ABDOMEN: Soft, non-tender, non-distended MUSCULOSKELETAL:  No edema; No deformity  SKIN: Warm and dry NEUROLOGIC:  Alert and oriented x 3 PSYCHIATRIC:  Normal affect     Signed, Shirlee More, MD  08/08/2022 11:04 AM    [place]

## 2022-08-09 ENCOUNTER — Encounter: Payer: Self-pay | Admitting: Cardiology

## 2022-08-09 ENCOUNTER — Ambulatory Visit: Payer: Medicare HMO | Attending: Cardiology | Admitting: Cardiology

## 2022-08-09 VITALS — BP 134/84 | HR 115 | Ht 60.0 in | Wt 140.0 lb

## 2022-08-09 DIAGNOSIS — R9431 Abnormal electrocardiogram [ECG] [EKG]: Secondary | ICD-10-CM

## 2022-08-09 DIAGNOSIS — I10 Essential (primary) hypertension: Secondary | ICD-10-CM | POA: Diagnosis not present

## 2022-08-09 DIAGNOSIS — E7800 Pure hypercholesterolemia, unspecified: Secondary | ICD-10-CM

## 2022-08-09 DIAGNOSIS — R55 Syncope and collapse: Secondary | ICD-10-CM | POA: Insufficient documentation

## 2022-08-09 NOTE — Patient Instructions (Signed)
Medication Instructions:  Your physician recommends that you continue on your current medications as directed. Please refer to the Current Medication list given to you today.  *If you need a refill on your cardiac medications before your next appointment, please call your pharmacy*   Lab Work: None If you have labs (blood work) drawn today and your tests are completely normal, you will receive your results only by: [place] (if you have MyChart) OR A paper copy in the mail If you have any lab test that is abnormal or we need to change your treatment, we will call you to review the results.   Testing/Procedures: None   Follow-Up: At [facility], you and your health needs are our priority.  As part of our continuing mission to provide you with exceptional heart care, we have created designated Provider Care Teams.  These Care Teams include your primary Cardiologist (physician) and Advanced Practice Providers (APPs -  Physician Assistants and Nurse Practitioners) who all work together to provide you with the care you need, when you need it.  We recommend signing up for the patient portal called "MyChart".  Sign up information is provided on this After Visit Summary.  MyChart is used to connect with patients for Virtual Visits (Telemedicine).  Patients are able to view lab/test results, encounter notes, upcoming appointments, etc.  Non-urgent messages can be sent to your provider as well.   To learn more about what you can do with MyChart, go to [email or web address].    Your next appointment:   Follow up as needed  Provider:   Shirlee More, MD    Other Instructions None  This visit was accompanied by Edwyna Shell

## 2022-08-23 DIAGNOSIS — E782 Mixed hyperlipidemia: Secondary | ICD-10-CM | POA: Diagnosis not present

## 2022-08-23 DIAGNOSIS — M150 Primary generalized (osteo)arthritis: Secondary | ICD-10-CM | POA: Diagnosis not present

## 2022-08-23 DIAGNOSIS — E1165 Type 2 diabetes mellitus with hyperglycemia: Secondary | ICD-10-CM | POA: Diagnosis not present

## 2022-08-23 DIAGNOSIS — I10 Essential (primary) hypertension: Secondary | ICD-10-CM | POA: Diagnosis not present

## 2022-08-23 DIAGNOSIS — F331 Major depressive disorder, recurrent, moderate: Secondary | ICD-10-CM | POA: Diagnosis not present

## 2022-08-23 DIAGNOSIS — E038 Other specified hypothyroidism: Secondary | ICD-10-CM | POA: Diagnosis not present

## 2022-08-23 DIAGNOSIS — D518 Other vitamin B12 deficiency anemias: Secondary | ICD-10-CM | POA: Diagnosis not present

## 2022-08-23 DIAGNOSIS — J449 Chronic obstructive pulmonary disease, unspecified: Secondary | ICD-10-CM | POA: Diagnosis not present

## 2022-08-23 DIAGNOSIS — E559 Vitamin D deficiency, unspecified: Secondary | ICD-10-CM | POA: Diagnosis not present

## 2022-08-31 DIAGNOSIS — I10 Essential (primary) hypertension: Secondary | ICD-10-CM | POA: Diagnosis not present

## 2022-08-31 DIAGNOSIS — F331 Major depressive disorder, recurrent, moderate: Secondary | ICD-10-CM | POA: Diagnosis not present

## 2022-08-31 DIAGNOSIS — D518 Other vitamin B12 deficiency anemias: Secondary | ICD-10-CM | POA: Diagnosis not present

## 2022-08-31 DIAGNOSIS — E559 Vitamin D deficiency, unspecified: Secondary | ICD-10-CM | POA: Diagnosis not present

## 2022-08-31 DIAGNOSIS — E038 Other specified hypothyroidism: Secondary | ICD-10-CM | POA: Diagnosis not present

## 2022-08-31 DIAGNOSIS — F411 Generalized anxiety disorder: Secondary | ICD-10-CM | POA: Diagnosis not present

## 2022-08-31 DIAGNOSIS — M150 Primary generalized (osteo)arthritis: Secondary | ICD-10-CM | POA: Diagnosis not present

## 2022-08-31 DIAGNOSIS — E1165 Type 2 diabetes mellitus with hyperglycemia: Secondary | ICD-10-CM | POA: Diagnosis not present

## 2022-08-31 DIAGNOSIS — E782 Mixed hyperlipidemia: Secondary | ICD-10-CM | POA: Diagnosis not present

## 2022-09-19 DIAGNOSIS — F419 Anxiety disorder, unspecified: Secondary | ICD-10-CM | POA: Diagnosis not present

## 2022-09-23 DIAGNOSIS — I10 Essential (primary) hypertension: Secondary | ICD-10-CM | POA: Diagnosis not present

## 2022-09-28 DIAGNOSIS — E559 Vitamin D deficiency, unspecified: Secondary | ICD-10-CM | POA: Diagnosis not present

## 2022-09-28 DIAGNOSIS — E782 Mixed hyperlipidemia: Secondary | ICD-10-CM | POA: Diagnosis not present

## 2022-09-28 DIAGNOSIS — F411 Generalized anxiety disorder: Secondary | ICD-10-CM | POA: Diagnosis not present

## 2022-09-28 DIAGNOSIS — M150 Primary generalized (osteo)arthritis: Secondary | ICD-10-CM | POA: Diagnosis not present

## 2022-09-28 DIAGNOSIS — D518 Other vitamin B12 deficiency anemias: Secondary | ICD-10-CM | POA: Diagnosis not present

## 2022-09-28 DIAGNOSIS — F331 Major depressive disorder, recurrent, moderate: Secondary | ICD-10-CM | POA: Diagnosis not present

## 2022-09-28 DIAGNOSIS — E1165 Type 2 diabetes mellitus with hyperglycemia: Secondary | ICD-10-CM | POA: Diagnosis not present

## 2022-09-28 DIAGNOSIS — I10 Essential (primary) hypertension: Secondary | ICD-10-CM | POA: Diagnosis not present

## 2022-09-28 DIAGNOSIS — E038 Other specified hypothyroidism: Secondary | ICD-10-CM | POA: Diagnosis not present

## 2022-10-10 DIAGNOSIS — F419 Anxiety disorder, unspecified: Secondary | ICD-10-CM | POA: Diagnosis not present

## 2022-10-30 DIAGNOSIS — D518 Other vitamin B12 deficiency anemias: Secondary | ICD-10-CM | POA: Diagnosis not present

## 2022-10-30 DIAGNOSIS — M150 Primary generalized (osteo)arthritis: Secondary | ICD-10-CM | POA: Diagnosis not present

## 2022-10-30 DIAGNOSIS — F411 Generalized anxiety disorder: Secondary | ICD-10-CM | POA: Diagnosis not present

## 2022-10-30 DIAGNOSIS — E782 Mixed hyperlipidemia: Secondary | ICD-10-CM | POA: Diagnosis not present

## 2022-10-30 DIAGNOSIS — F331 Major depressive disorder, recurrent, moderate: Secondary | ICD-10-CM | POA: Diagnosis not present

## 2022-10-30 DIAGNOSIS — E038 Other specified hypothyroidism: Secondary | ICD-10-CM | POA: Diagnosis not present

## 2022-10-30 DIAGNOSIS — I10 Essential (primary) hypertension: Secondary | ICD-10-CM | POA: Diagnosis not present

## 2022-10-30 DIAGNOSIS — E1165 Type 2 diabetes mellitus with hyperglycemia: Secondary | ICD-10-CM | POA: Diagnosis not present

## 2022-10-30 DIAGNOSIS — E559 Vitamin D deficiency, unspecified: Secondary | ICD-10-CM | POA: Diagnosis not present

## 2022-11-01 DIAGNOSIS — E1165 Type 2 diabetes mellitus with hyperglycemia: Secondary | ICD-10-CM | POA: Diagnosis not present

## 2022-11-01 DIAGNOSIS — E559 Vitamin D deficiency, unspecified: Secondary | ICD-10-CM | POA: Diagnosis not present

## 2022-11-01 DIAGNOSIS — E782 Mixed hyperlipidemia: Secondary | ICD-10-CM | POA: Diagnosis not present

## 2022-11-01 DIAGNOSIS — I10 Essential (primary) hypertension: Secondary | ICD-10-CM | POA: Diagnosis not present

## 2022-11-01 DIAGNOSIS — F331 Major depressive disorder, recurrent, moderate: Secondary | ICD-10-CM | POA: Diagnosis not present

## 2022-11-01 DIAGNOSIS — D518 Other vitamin B12 deficiency anemias: Secondary | ICD-10-CM | POA: Diagnosis not present

## 2022-11-01 DIAGNOSIS — E038 Other specified hypothyroidism: Secondary | ICD-10-CM | POA: Diagnosis not present

## 2022-11-01 DIAGNOSIS — Z79899 Other long term (current) drug therapy: Secondary | ICD-10-CM | POA: Diagnosis not present

## 2022-11-01 DIAGNOSIS — M150 Primary generalized (osteo)arthritis: Secondary | ICD-10-CM | POA: Diagnosis not present

## 2022-11-07 DIAGNOSIS — E559 Vitamin D deficiency, unspecified: Secondary | ICD-10-CM | POA: Diagnosis not present

## 2022-11-07 DIAGNOSIS — E782 Mixed hyperlipidemia: Secondary | ICD-10-CM | POA: Diagnosis not present

## 2022-11-23 DIAGNOSIS — I10 Essential (primary) hypertension: Secondary | ICD-10-CM | POA: Diagnosis not present

## 2023-07-11 ENCOUNTER — Ambulatory Visit: Payer: Medicare HMO | Admitting: Gastroenterology

## 2023-07-11 ENCOUNTER — Other Ambulatory Visit: Payer: Medicare HMO

## 2023-07-11 ENCOUNTER — Encounter: Payer: Self-pay | Admitting: Gastroenterology

## 2023-07-11 VITALS — BP 130/80 | HR 96 | Ht 60.0 in | Wt 131.0 lb

## 2023-07-11 DIAGNOSIS — R198 Other specified symptoms and signs involving the digestive system and abdomen: Secondary | ICD-10-CM | POA: Diagnosis not present

## 2023-07-11 DIAGNOSIS — R159 Full incontinence of feces: Secondary | ICD-10-CM

## 2023-07-11 DIAGNOSIS — R1030 Lower abdominal pain, unspecified: Secondary | ICD-10-CM

## 2023-07-11 DIAGNOSIS — K582 Mixed irritable bowel syndrome: Secondary | ICD-10-CM | POA: Diagnosis not present

## 2023-07-11 DIAGNOSIS — R32 Unspecified urinary incontinence: Secondary | ICD-10-CM

## 2023-07-11 LAB — CBC WITH DIFFERENTIAL/PLATELET
Basophils Absolute: 0.1 10*3/uL (ref 0.0–0.1)
Basophils Relative: 1.2 % (ref 0.0–3.0)
Eosinophils Absolute: 0.2 10*3/uL (ref 0.0–0.7)
Eosinophils Relative: 1.7 % (ref 0.0–5.0)
HCT: 42.1 % (ref 36.0–46.0)
Hemoglobin: 14 g/dL (ref 12.0–15.0)
Lymphocytes Relative: 24.8 % (ref 12.0–46.0)
Lymphs Abs: 2.5 10*3/uL (ref 0.7–4.0)
MCHC: 33.1 g/dL (ref 30.0–36.0)
MCV: 97.3 fL (ref 78.0–100.0)
Monocytes Absolute: 0.8 10*3/uL (ref 0.1–1.0)
Monocytes Relative: 7.9 % (ref 3.0–12.0)
Neutro Abs: 6.6 10*3/uL (ref 1.4–7.7)
Neutrophils Relative %: 64.4 % (ref 43.0–77.0)
Platelets: 271 10*3/uL (ref 150.0–400.0)
RBC: 4.33 Mil/uL (ref 3.87–5.11)
RDW: 13.7 % (ref 11.5–15.5)
WBC: 10.3 10*3/uL (ref 4.0–10.5)

## 2023-07-11 LAB — COMPREHENSIVE METABOLIC PANEL
ALT: 12 U/L (ref 0–35)
AST: 18 U/L (ref 0–37)
Albumin: 4 g/dL (ref 3.5–5.2)
Alkaline Phosphatase: 100 U/L (ref 39–117)
BUN: 16 mg/dL (ref 6–23)
CO2: 28 meq/L (ref 19–32)
Calcium: 9.4 mg/dL (ref 8.4–10.5)
Chloride: 103 meq/L (ref 96–112)
Creatinine, Ser: 0.9 mg/dL (ref 0.40–1.20)
GFR: 63.41 mL/min (ref >60.00)
Glucose, Bld: 98 mg/dL (ref 70–99)
Potassium: 3.9 meq/L (ref 3.5–5.1)
Sodium: 138 meq/L (ref 135–145)
Total Bilirubin: 0.5 mg/dL (ref 0.2–1.2)
Total Protein: 6.9 g/dL (ref 6.0–8.3)

## 2023-07-11 LAB — TSH: TSH: 1.43 u[IU]/mL (ref 0.35–5.50)

## 2023-07-11 NOTE — Progress Notes (Signed)
Chief Complaint: IBS Primary GI MD: Gentry Fitz  HPI: 73 year old female history of hypertension and hyperlipidemia presents for IBS  Patient was admitted to [facility] 07/29/2022 and discharged the next day with EKG showing T wave inversions.  Cardiac enzymes were normal. Patient was seen by cardiology August 09, 2022 with possible abnormal EKG from PCP which is why she was sent to the ED.  Patient had normal echocardiogram and cardiologist felt there was no further workup to be pursued.   The patient, with a history of hypertension and hyperlipidemia, presents with a longstanding history of gastrointestinal symptoms, which she believes to be Irritable Bowel Syndrome (IBS). She reports a history of colonoscopy in 2013, during which polyps were removed, but no follow-up was scheduled. Since then, she has experienced persistent abdominal pain, which she describes as constant, though it occasionally eases. The pain is located in the lower abdomen.  The patient's bowel habits have been irregular, alternating between constipation and diarrhea. She reports a pattern of severe abdominal pain followed by episodes of diarrhea, after which she experiences constipation for several days. This cycle has been ongoing for years. She has tried various medications in the past, which she reports led to constipation.  In addition to these symptoms, the patient has experienced unintentional weight loss of approximately 25 pounds over the past six months, which she attributes to a decreased appetite. She also reports episodes of fecal incontinence approximately once every two weeks, which has been ongoing for an extended period.  The patient also mentions a history of bladder control problems, including instances where she was unable to reach the bathroom in time. She has been using adult diapers to manage this issue. She reports a history of being diagnosed with a 'spastic colon' at the age of 68.   She  expresses a reluctance to undergo another colonoscopy, citing previous normal results and the removal of a few polyps. She feels a colonoscopy "isn't necessary"  Patient states her last colonoscopy was with Dr. Chales Abrahams though in [facility] it appears it was Mishra     PREVIOUS GI WORKUP   Colonoscopy 04/2012 with digestive health specialists for heme positive stool 1.  8 mm two polyps in the descending colon;  Therapies performed: A polypectomy was performed using a cold snare, the sample  was sent to pathology for analysis.  2.  Small hemorrhoid, external in the anal canal  3.  Retroflexed views revealed internal hemorrhoid No pathology report available Recommended repeat 5 years  Past Medical History:  Diagnosis Date   Abnormal EKG    Hypercholesterolemia    Hypertension    Syncope and collapse     Past Surgical History:  Procedure Laterality Date   ANKLE SURGERY Right    TOE SURGERY Left     Current Outpatient Medications  Medication Sig Dispense Refill   atorvastatin (LIPITOR) 20 MG tablet Take 20 mg by mouth daily.     CELEBREX 200 MG capsule Take 200 mg by mouth daily.     Cyanocobalamin 1000 MCG TBCR Take 1,000 mcg by mouth daily.     DULoxetine (CYMBALTA) 30 MG capsule Take 30 mg by mouth daily.     nebivolol (BYSTOLIC) 5 MG tablet Take 5 mg by mouth daily.     tiZANidine (ZANAFLEX) 4 MG tablet Take 4 mg by mouth 3 (three) times daily as needed for muscle spasms.     traZODone (DESYREL) 100 MG tablet Take 100 mg by mouth at bedtime.  No current facility-administered medications for this visit.    Allergies as of 07/11/2023 - Review Complete 07/11/2023  Allergen Reaction Noted   Morphine Nausea And Vomiting 05/06/2012    Family History  Adopted: Yes  Problem Relation Age of Onset   Epilepsy Mother    Cancer Father     Social History   Socioeconomic History   Marital status: Single    Spouse name: Not on file   Number of children: Not on file   Years of  education: Not on file   Highest education level: Not on file  Occupational History   Not on file  Tobacco Use   Smoking status: Every Day    Types: Cigarettes    Passive exposure: Never   Smokeless tobacco: Never  Vaping Use   Vaping status: Never Used  Substance and Sexual Activity   Alcohol use: Yes    Comment: very rare   Drug use: Never   Sexual activity: Not on file  Other Topics Concern   Not on file  Social History Narrative   Not on file   Social Drivers of Health   Financial Resource Strain: Not on file  Food Insecurity: Not on file  Transportation Needs: Not on file  Physical Activity: Not on file  Stress: Not on file  Social Connections: Not on file  Intimate Partner Violence: Not on file    Review of Systems:    Constitutional: No weight loss, fever, chills, weakness or fatigue HEENT: Eyes: No change in vision               Ears, Nose, Throat:  No change in hearing or congestion Skin: No rash or itching Cardiovascular: No chest pain, chest pressure or palpitations   Respiratory: No SOB or cough Gastrointestinal: See HPI and otherwise negative Genitourinary: No dysuria or change in urinary frequency Neurological: No headache, dizziness or syncope Musculoskeletal: No new muscle or joint pain Hematologic: No bleeding or bruising Psychiatric: No history of depression or anxiety    Physical Exam:  Vital signs: BP 130/80 (BP Location: Left Arm, Patient Position: Sitting, Cuff Size: Normal)   Pulse 96   Ht 5' (1.524 m)   Wt 131 lb (59.4 kg)   SpO2 94%   BMI 25.58 kg/m   Constitutional: NAD, Well developed, Well nourished, alert and cooperative Head:  Normocephalic and atraumatic. Eyes:   PEERL, EOMI. No icterus. Conjunctiva pink. Respiratory: Respirations even and unlabored. Lungs clear to auscultation bilaterally.   No wheezes, crackles, or rhonchi.  Cardiovascular:  Regular rate and rhythm. No peripheral edema, cyanosis or pallor.   Gastrointestinal:  Soft, nondistended, nontender. No rebound or guarding. Hypoactive bowel sounds. No appreciable masses or hepatomegaly. Rectal:  Not performed. Patient declined. Msk:  Symmetrical without gross deformities. Without edema, no deformity or joint abnormality.  Neurologic:  Alert and  oriented x4;  grossly normal neurologically.  Skin:   Dry and intact without significant lesions or rashes. Psychiatric: Oriented to person, place and time. Demonstrates good judgement and reason without abnormal affect or behaviors.   RELEVANT LABS AND IMAGING: CBC No results found for: "WBC", "RBC", "HGB", "HCT", "PLT", "MCV", "MCH", "MCHC", "RDW", "LYMPHSABS", "MONOABS", "EOSABS", "BASOSABS"  CMP  No results found for: "NA", "K", "CL", "CO2", "GLUCOSE", "BUN", "CREATININE", "CALCIUM", "PROT", "ALBUMIN", "AST", "ALT", "ALKPHOS", "BILITOT", "GFRNONAA", "GFRAA"   Assessment/Plan:      Irritable Bowel Syndrome (IBS) Chronic abdominal pain, alternating constipation and diarrhea, fecal incontinence. History of colon polyps. Unintentional weight loss  over the past 6 months. No family history of colon cancer.  Suspect patient is in a cycle of constipation which also has overflow diarrhea and she treats this with Imodium which worsens her constipation. --Order abdominal x-ray to assess for constipation. --CBC, CMP. TSH --Start fiber supplement daily to regulate bowel movements. --Provide samples of IBgard for pain management. --Refer to pelvic floor physical therapy for fecal incontinence and possible bladder control issues. --Consider colonoscopy if symptoms persist or worsen, patient declines at this time and is reluctant. -- close follow up 8 weeks. If no improvement, recommend Linzess or Amitiza  Urinary incontinence Fecal incontinence  patient declined rectal exam today suspect with presence of urinary and fecal incontinence she likely has weakened pelvic floor.  Recommend pelvic floor  physical therapy.  Patient declines colonoscopy and rectal exam. - Referral to pelvic floor physical therapy - If no improvement, would continue to recommend colonoscopy  Follow-up in 8-12 weeks to assess response to treatment. Patient to contact office if symptoms do not improve within a few weeks.           Lara Mulch [facility] 07/11/2023, 3:12 PM  Cc: Galvin Proffer, MD

## 2023-07-11 NOTE — Patient Instructions (Addendum)
 Your provider has requested that you go to the basement level for lab work before leaving today. Press "B" on the elevator. The lab is located at the first door on the left as you exit the elevator.  Your provider has requested that you have an abdominal x ray before leaving today. Please go to the basement floor to our Radiology department for the test.   A high fiber diet with plenty of fluids (up to 8 glasses of water daily) is suggested to relieve these symptoms.  Metamucil or benefiber, 1 tablespoon once or twice daily can be used   Call our [facility] today to learn more and to make an appointment!  [address] [place], [place] [address] Phone: [phone]  A referral was place for pelvic floor therapy they will call you to scheduled a appointment  Follow up in 6 months  If your blood pressure at your visit was 140/90 or greater, please contact your primary care physician to follow up on this.  _______________________________________________________  If you are age 90 or older, your body mass index should be between 23-30. Your Body mass index is 25.58 kg/m. If this is out of the aforementioned range listed, please consider follow up with your Primary Care Provider.  If you are age 31 or younger, your body mass index should be between 19-25. Your Body mass index is 25.58 kg/m. If this is out of the aformentioned range listed, please consider follow up with your Primary Care Provider.   ________________________________________________________  The [facility] providers would like to encourage you to use MYCHART to communicate with providers for non-urgent requests or questions.  Due to long hold times on the telephone, sending your provider a message by [facility] may be a faster and more efficient way to get a response.  Please allow 48 business hours for a response.  Please remember that this is for non-urgent requests.   _______________________________________________________   Thank you for entrusting me with your care and choosing [facility].  Bayley Luan Rumpf, PA-C

## 2023-07-25 ENCOUNTER — Ambulatory Visit (INDEPENDENT_AMBULATORY_CARE_PROVIDER_SITE_OTHER)
Admission: RE | Admit: 2023-07-25 | Discharge: 2023-07-25 | Disposition: A | Discharge status: Home / Self Care | Payer: Medicare HMO | Source: Ambulatory Visit | Attending: Gastroenterology | Admitting: Gastroenterology

## 2023-07-25 DIAGNOSIS — R1030 Lower abdominal pain, unspecified: Secondary | ICD-10-CM | POA: Diagnosis not present

## 2023-07-25 DIAGNOSIS — R198 Other specified symptoms and signs involving the digestive system and abdomen: Secondary | ICD-10-CM | POA: Diagnosis not present

## 2023-07-25 DIAGNOSIS — R159 Full incontinence of feces: Secondary | ICD-10-CM | POA: Diagnosis not present

## 2023-07-27 NOTE — Progress Notes (Signed)
 Agree with assessment/plan.  Edman Circle, MD [facility] [phone]

## 2023-07-30 ENCOUNTER — Other Ambulatory Visit: Payer: Self-pay | Admitting: *Deleted

## 2023-07-30 DIAGNOSIS — R1030 Lower abdominal pain, unspecified: Secondary | ICD-10-CM

## 2023-07-30 DIAGNOSIS — K5900 Constipation, unspecified: Secondary | ICD-10-CM

## 2023-07-30 DIAGNOSIS — R634 Abnormal weight loss: Secondary | ICD-10-CM

## 2023-07-31 ENCOUNTER — Telehealth: Payer: Self-pay | Admitting: Gastroenterology

## 2023-07-31 NOTE — Telephone Encounter (Signed)
Patient called back requesting to speak with Dottie for message below.

## 2023-07-31 NOTE — Telephone Encounter (Signed)
Patient called and stated she forgot the dosage for the Miralax and other recommendations.

## 2023-07-31 NOTE — Telephone Encounter (Signed)
Spoke to patient. She needed clarity on how much/often to take miralax and benefiber. She is advised 1 capful of miralax daily for now and 1 tsp of benefiber, working to 1 tbsp benefiber daily. She verbalizes understanding.

## 2023-08-07 ENCOUNTER — Ambulatory Visit (HOSPITAL_COMMUNITY)
Admission: RE | Admit: 2023-08-07 | Discharge: 2023-08-07 | Disposition: A | Discharge status: Home / Self Care | Payer: Medicare HMO | Source: Ambulatory Visit | Attending: Gastroenterology | Admitting: Gastroenterology

## 2023-08-07 DIAGNOSIS — R634 Abnormal weight loss: Secondary | ICD-10-CM | POA: Insufficient documentation

## 2023-08-07 DIAGNOSIS — R1030 Lower abdominal pain, unspecified: Secondary | ICD-10-CM | POA: Insufficient documentation

## 2023-08-07 DIAGNOSIS — K5900 Constipation, unspecified: Secondary | ICD-10-CM | POA: Insufficient documentation

## 2023-08-07 MED ORDER — IOHEXOL 300 MG/ML  SOLN
100.0000 mL | Freq: Once | INTRAMUSCULAR | Status: AC | PRN
  Administered 2023-08-07: 100 mL via INTRAVENOUS

## 2023-08-21 NOTE — Telephone Encounter (Signed)
 Inbound call from patient, would like to know if she needs any follow up appointment per her procedure results.

## 2023-09-28 ENCOUNTER — Ambulatory Visit: Payer: Medicare HMO | Admitting: Gastroenterology

## 2023-09-28 NOTE — Progress Notes (Deleted)
 Chief Complaint: Follow-up Primary GI MD: Dr. Chales Abrahams  HPI: 73 year old female with history of hypertension, hyperlipidemia, IBS, presents for follow-up.  Last seen January 2025.  Please see this note for details. History of chronic abdominal pain alternating constipation, diarrhea, fecal incontinence.  Suspected to be overflow diarrhea worsening by regular Imodium use.  Patient declined rectal exam and colonoscopy.  CBC, CMP, TSH were normal KUB confirmed constipation.  Patient was recommended to take MiraLAX 1 capful daily  CT abdomen pelvis with contrast 07/2023 showed colonic diverticulosis without diverticulitis.  Small internal hemorrhoids.  No acute findings.   Discussed the use of AI scribe software for clinical note transcription with the patient, who gave verbal consent to proceed.  History of Present Illness      PREVIOUS GI WORKUP   Colonoscopy 04/2012 with digestive health specialists for heme positive stool 1.  8 mm two polyps in the descending colon;  Therapies performed: A polypectomy was performed using a cold snare, the sample  was sent to pathology for analysis.  2.  Small hemorrhoid, external in the anal canal  3.  Retroflexed views revealed internal hemorrhoid No pathology report available Recommended repeat 5 years  Past Medical History:  Diagnosis Date   Abnormal EKG    Hypercholesterolemia    Hypertension    Syncope and collapse     Past Surgical History:  Procedure Laterality Date   ANKLE SURGERY Right    TOE SURGERY Left     Current Outpatient Medications  Medication Sig Dispense Refill   atorvastatin (LIPITOR) 20 MG tablet Take 20 mg by mouth daily.     CELEBREX 200 MG capsule Take 200 mg by mouth daily.     Cyanocobalamin 1000 MCG TBCR Take 1,000 mcg by mouth daily.     DULoxetine (CYMBALTA) 30 MG capsule Take 30 mg by mouth daily.     nebivolol (BYSTOLIC) 5 MG tablet Take 5 mg by mouth daily.     tiZANidine (ZANAFLEX) 4 MG tablet  Take 4 mg by mouth 3 (three) times daily as needed for muscle spasms.     traZODone (DESYREL) 100 MG tablet Take 100 mg by mouth at bedtime.     No current facility-administered medications for this visit.    Allergies as of 09/28/2023 - Review Complete 07/11/2023  Allergen Reaction Noted   Morphine Nausea And Vomiting 05/06/2012    Family History  Adopted: Yes  Problem Relation Age of Onset   Epilepsy Mother    Cancer Father     Social History   Socioeconomic History   Marital status: Single    Spouse name: Not on file   Number of children: Not on file   Years of education: Not on file   Highest education level: Not on file  Occupational History   Not on file  Tobacco Use   Smoking status: Every Day    Types: Cigarettes    Passive exposure: Never   Smokeless tobacco: Never  Vaping Use   Vaping status: Never Used  Substance and Sexual Activity   Alcohol use: Yes    Comment: very rare   Drug use: Never   Sexual activity: Not on file  Other Topics Concern   Not on file  Social History Narrative   Not on file   Social Drivers of Health   Financial Resource Strain: Not on file  Food Insecurity: Not on file  Transportation Needs: Not on file  Physical Activity: Not on file  Stress: Not  on file  Social Connections: Not on file  Intimate Partner Violence: Not on file    Review of Systems:    Constitutional: No weight loss, fever, chills, weakness or fatigue HEENT: Eyes: No change in vision               Ears, Nose, Throat:  No change in hearing or congestion Skin: No rash or itching Cardiovascular: No chest pain, chest pressure or palpitations   Respiratory: No SOB or cough Gastrointestinal: See HPI and otherwise negative Genitourinary: No dysuria or change in urinary frequency Neurological: No headache, dizziness or syncope Musculoskeletal: No new muscle or joint pain Hematologic: No bleeding or bruising Psychiatric: No history of depression or  anxiety    Physical Exam:  Vital signs: There were no vitals taken for this visit.  Constitutional: NAD, Well developed, Well nourished, alert and cooperative Head:  Normocephalic and atraumatic. Eyes:   PEERL, EOMI. No icterus. Conjunctiva pink. Respiratory: Respirations even and unlabored. Lungs clear to auscultation bilaterally.   No wheezes, crackles, or rhonchi.  Cardiovascular:  Regular rate and rhythm. No peripheral edema, cyanosis or pallor.  Gastrointestinal:  Soft, nondistended, nontender. No rebound or guarding. Normal bowel sounds. No appreciable masses or hepatomegaly. Rectal:  Not performed.  Msk:  Symmetrical without gross deformities. Without edema, no deformity or joint abnormality.  Neurologic:  Alert and  oriented x4;  grossly normal neurologically.  Skin:   Dry and intact without significant lesions or rashes. Psychiatric: Oriented to person, place and time. Demonstrates good judgement and reason without abnormal affect or behaviors.  Physical Exam    RELEVANT LABS AND IMAGING: CBC    Component Value Date/Time   WBC 10.3 07/11/2023 1501   RBC 4.33 07/11/2023 1501   HGB 14.0 07/11/2023 1501   HCT 42.1 07/11/2023 1501   PLT 271.0 07/11/2023 1501   MCV 97.3 07/11/2023 1501   MCHC 33.1 07/11/2023 1501   RDW 13.7 07/11/2023 1501   LYMPHSABS 2.5 07/11/2023 1501   MONOABS 0.8 07/11/2023 1501   EOSABS 0.2 07/11/2023 1501   BASOSABS 0.1 07/11/2023 1501    CMP     Component Value Date/Time   NA 138 07/11/2023 1501   K 3.9 07/11/2023 1501   CL 103 07/11/2023 1501   CO2 28 07/11/2023 1501   GLUCOSE 98 07/11/2023 1501   BUN 16 07/11/2023 1501   CREATININE 0.90 07/11/2023 1501   CALCIUM 9.4 07/11/2023 1501   PROT 6.9 07/11/2023 1501   ALBUMIN 4.0 07/11/2023 1501   AST 18 07/11/2023 1501   ALT 12 07/11/2023 1501   ALKPHOS 100 07/11/2023 1501   BILITOT 0.5 07/11/2023 1501     Assessment/Plan:   Assessment and Plan Assessment &  Plan        Lara Mulch [facility] 09/28/2023, 10:06 AM  Cc: Galvin Proffer, MD

## 2023-10-08 NOTE — Therapy (Deleted)
 OUTPATIENT PHYSICAL THERAPY FEMALE PELVIC EVALUATION   Patient Name: Brenda Henry MRN: [id number] DOB:1950-09-25, 73 y.o., female Today's Date: 10/08/2023  END OF SESSION:   Past Medical History:  Diagnosis Date   Abnormal EKG    Hypercholesterolemia    Hypertension    Syncope and collapse    Past Surgical History:  Procedure Laterality Date   ANKLE SURGERY Right    TOE SURGERY Left    Patient Active Problem List   Diagnosis Date Noted   Hypertension 08/09/2022   Hypercholesterolemia 08/09/2022   Abnormal EKG 08/09/2022   Syncope and collapse 08/09/2022    PCP: Shelbie Ammons, MD  REFERRING PROVIDER: Legrand Como, PA-C   REFERRING DIAG:  R19.8 (ICD-10-CM) - Alternating constipation and diarrhea  R10.30 (ICD-10-CM) - Lower abdominal pain  R15.9 (ICD-10-CM) - Incontinence of feces, unspecified fecal incontinence type  R32 (ICD-10-CM) - Urinary incontinence, unspecified type    THERAPY DIAG:  No diagnosis found.  Rationale for Evaluation and Treatment: Rehabilitation  ONSET DATE: ***  SUBJECTIVE:                                                                                                                                                                                           SUBJECTIVE STATEMENT: *** Fluid intake:   PAIN:  Are you having pain? {yes/no:20286} NPRS scale: ***/10 Pain location: {pelvic pain location:27098}  Pain type: {type:313116} Pain description: {PAIN DESCRIPTION:21022940}   Aggravating factors: *** Relieving factors: ***  PRECAUTIONS: None  RED FLAGS: {PT Red Flags:29287}   WEIGHT BEARING RESTRICTIONS: No  FALLS:  Has patient fallen in last 6 months? {fallsyesno:27318}  OCCUPATION: ***  ACTIVITY LEVEL : ***  PLOF: {PLOF:24004}  PATIENT GOALS: ***  PERTINENT HISTORY:  *** Sexual abuse: {Yes/No:304960894}  BOWEL MOVEMENT: Pain with bowel movement: {yes/no:20286} Type of bowel movement:{PT BM  type:27100} Fully empty rectum: {No/Yes:304960894} Leakage: {Yes/No:304960894} Pads: {Yes/No:304960894} Fiber supplement/laxative {YES/NO AS:20300}  URINATION: Pain with urination: {yes/no:20286} Fully empty bladder: {Yes/No:[id number]}*** Stream: {PT urination:27102} Urgency: {YES/NO AS:20300} Frequency: *** Leakage: {PT leakage:27103} Pads: {Yes/No:304960894}  INTERCOURSE:  Ability to have vaginal penetration {YES/NO:21197} Pain with intercourse: {pain with intercourse PA:27099} Dryness{YES/NO AS:20300} Climax: *** Marinoff Scale: ***/3 Laxative:  PREGNANCY: Vaginal deliveries *** Tearing {Yes***/No:304960894} Episiotomy {YES/NO AS:20300} C-section deliveries *** Currently pregnant {Yes***/No:304960894}  PROLAPSE: {PT prolapse:27101}   OBJECTIVE:  Note: Objective measures were completed at Evaluation unless otherwise noted.  DIAGNOSTIC FINDINGS:  ***  PATIENT SURVEYS:  {rehab surveys:24030}  PFIQ-7: ***  COGNITION: Overall cognitive status: {cognition:24006}     SENSATION: Light touch: {intact/deficits:24005}  LUMBAR SPECIAL TESTS:  {lumbar special test:25242}  FUNCTIONAL TESTS:  {  Functional tests:24029}  GAIT: Assistive device utilized: {Assistive devices:23999} Comments: ***  POSTURE: {posture:25561}   LUMBARAROM/PROM:  A/PROM A/PROM  eval  Flexion   Extension   Right lateral flexion   Left lateral flexion   Right rotation   Left rotation    (Blank rows = not tested)  LOWER EXTREMITY ROM:  {AROM/PROM:27142} ROM Right eval Left eval  Hip flexion    Hip extension    Hip abduction    Hip adduction    Hip internal rotation    Hip external rotation    Knee flexion    Knee extension    Ankle dorsiflexion    Ankle plantarflexion    Ankle inversion    Ankle eversion     (Blank rows = not tested)  LOWER EXTREMITY MMT:  MMT Right eval Left eval  Hip flexion    Hip extension    Hip abduction    Hip adduction    Hip  internal rotation    Hip external rotation    Knee flexion    Knee extension    Ankle dorsiflexion    Ankle plantarflexion    Ankle inversion    Ankle eversion     (Blank rows = not tested) PALPATION:   General: ***  Pelvic Alignment: ***  Abdominal: ***                External Perineal Exam: ***                             Internal Pelvic Floor: ***  Patient confirms identification and approves PT to assess internal pelvic floor and treatment {yes/no:20286}  PELVIC MMT:   MMT eval  Vaginal   Internal Anal Sphincter   External Anal Sphincter   Puborectalis   Diastasis Recti   (Blank rows = not tested)        TONE: ***  PROLAPSE: ***  TODAY'S TREATMENT:                                                                                                                              DATE: ***  EVAL ***   PATIENT EDUCATION:  Education details: *** Person educated: {Person educated:25204} Education method: {Education Method:25205} Education comprehension: {Education Comprehension:25206}  HOME EXERCISE PROGRAM: ***  ASSESSMENT:  CLINICAL IMPRESSION: Patient is a *** y.o. *** who was seen today for physical therapy evaluation and treatment for ***.   OBJECTIVE IMPAIRMENTS: {opptimpairments:25111}.   ACTIVITY LIMITATIONS: {activitylimitations:27494}  PARTICIPATION LIMITATIONS: {participationrestrictions:25113}  PERSONAL FACTORS: {Personal factors:25162} are also affecting patient's functional outcome.   REHAB POTENTIAL: {rehabpotential:25112}  CLINICAL DECISION MAKING: {clinical decision making:25114}  EVALUATION COMPLEXITY: {Evaluation complexity:25115}   GOALS: Goals reviewed with patient? {yes/no:20286}  SHORT TERM GOALS: Target date: ***  *** Baseline: Goal status: INITIAL  2.  *** Baseline:  Goal status: INITIAL  3.  *** Baseline:  Goal status: INITIAL  4.  ***  Baseline:  Goal status: INITIAL  5.  *** Baseline:  Goal status:  INITIAL  6.  *** Baseline:  Goal status: INITIAL  LONG TERM GOALS: Target date: ***  *** Baseline:  Goal status: INITIAL  2.  *** Baseline:  Goal status: INITIAL  3.  *** Baseline:  Goal status: INITIAL  4.  *** Baseline:  Goal status: INITIAL  5.  *** Baseline:  Goal status: INITIAL  6.  *** Baseline:  Goal status: INITIAL  PLAN:  PT FREQUENCY: {rehab frequency:25116}  PT DURATION: {rehab duration:25117}  PLANNED INTERVENTIONS: {rehab planned interventions:25118::"97110-Therapeutic exercises","97530- Therapeutic [phone]- Neuromuscular re-education","97535- Self JXBJ","47829- Manual therapy"}  PLAN FOR NEXT SESSION: ***   Rema Lievanos, PT 10/08/2023, 9:26 AM

## 2023-10-09 ENCOUNTER — Encounter: Payer: Medicare HMO | Admitting: Physical Therapy

## 2023-10-16 ENCOUNTER — Encounter: Payer: Medicare HMO | Admitting: Physical Therapy

## 2023-10-23 ENCOUNTER — Encounter: Payer: Medicare HMO | Admitting: Physical Therapy

## 2023-10-30 ENCOUNTER — Encounter: Payer: Medicare HMO | Admitting: Physical Therapy

## 2024-01-11 IMAGING — MR MR FOOT*L* WO/W CM
6 of 9 series · 26 of 40 positions shown · IV contrast (multihance)
Comparison: None Available.

CLINICAL DATA: Growth on left third toe dorsal surface growing
since 8445, worsening recently

EXAM:
MRI OF THE LEFT FOREFOOT WITHOUT AND WITH CONTRAST
TECHNIQUE: Multiplanar, multisequence MR imaging of the left forefoot was
performed both before and after administration of intravenous
contrast.
CONTRAST:  13mL MULTIHANCE GADOBENATE DIMEGLUMINE 529 MG/ML IV SOLN

[Series 4: T1 · coronal · 3.0mm · 0.20mm/px · 5 of 48 slices shown (1 of 2)]
[im 1/48]
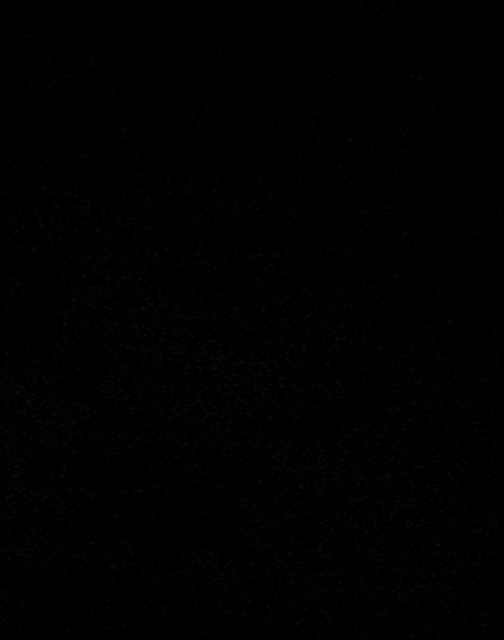
[im 12/48]
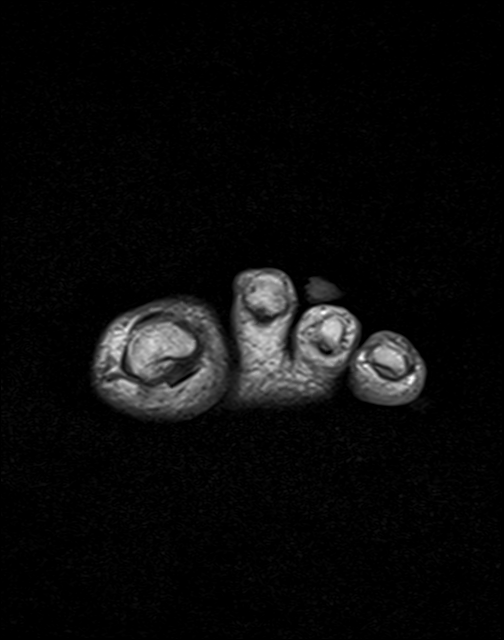
[im 24/48]
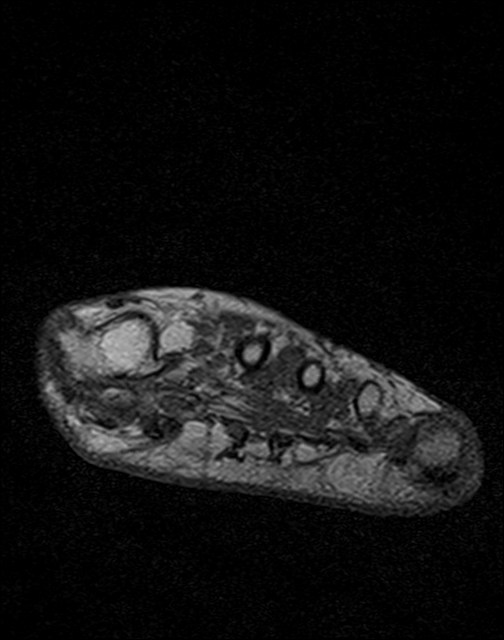
[im 36/48]
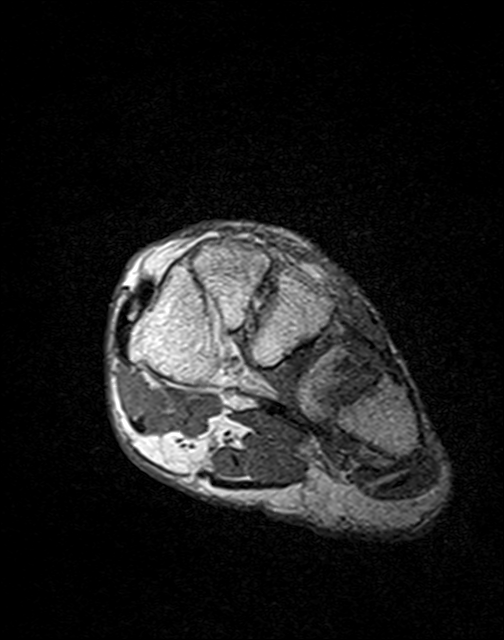
[im 48/48]
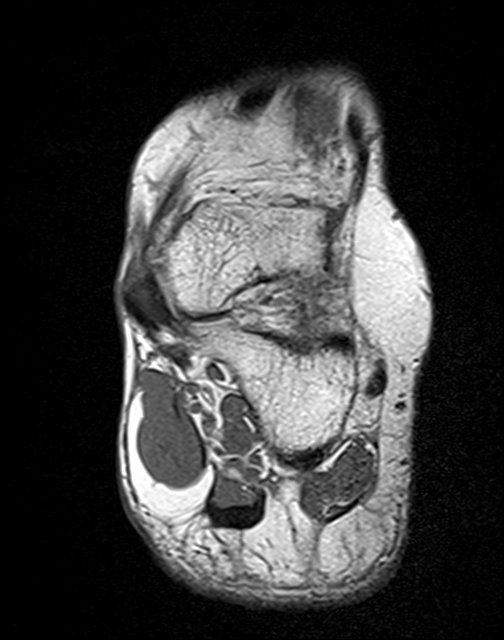

[Series 5: T2 fat-sat · coronal · 3.0mm · 0.20mm/px · 6 of 48 slices shown (1 of 2)]
[im 1/48]
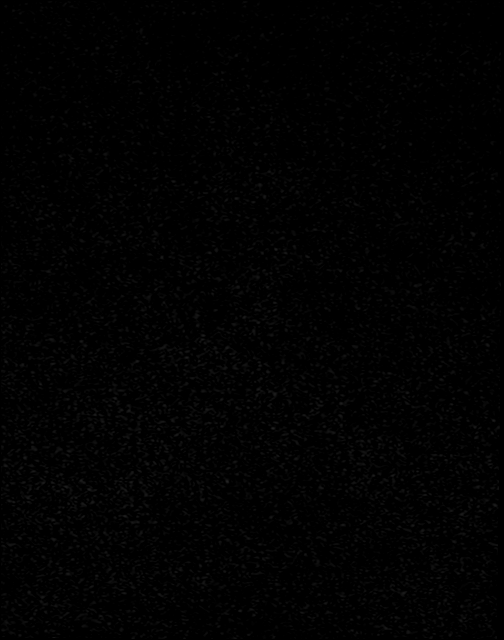
[im 10/48]
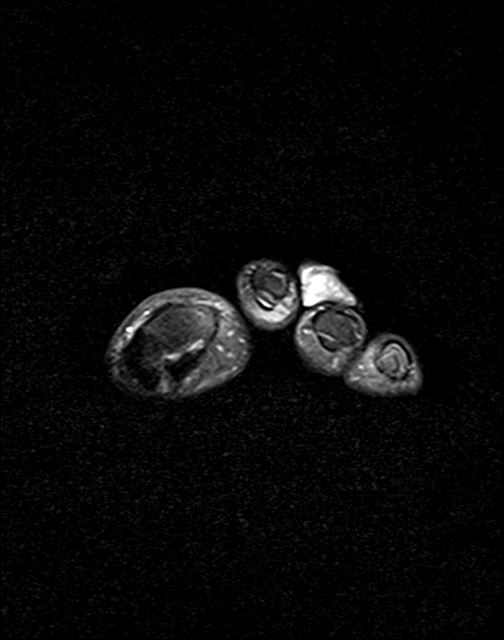
[im 19/48]
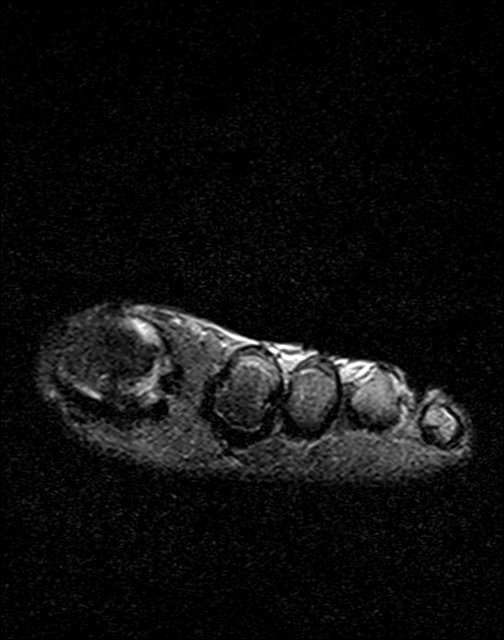
[im 29/48]
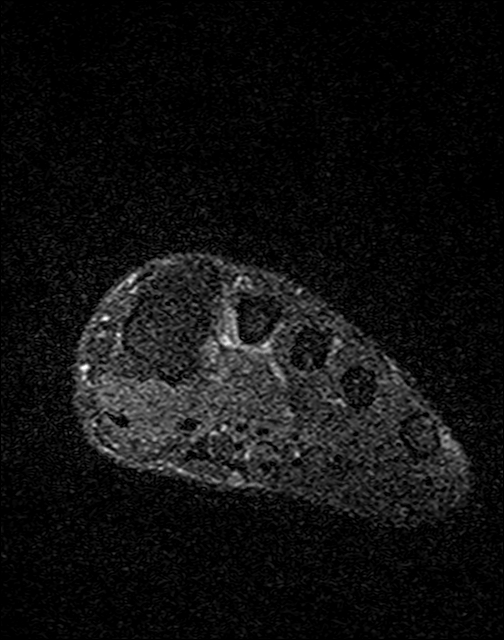
[im 38/48]
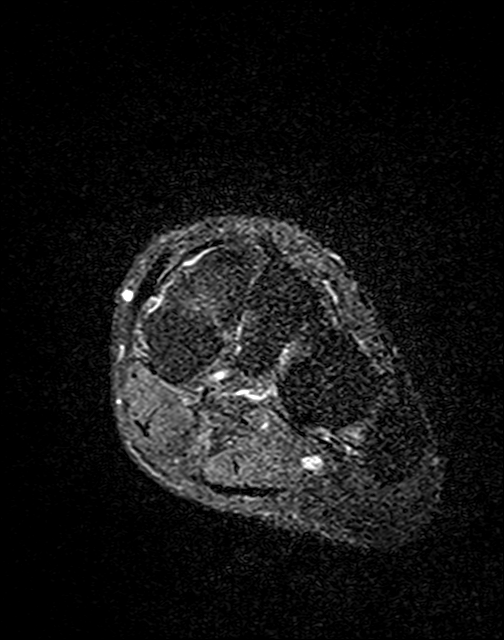
[im 48/48]
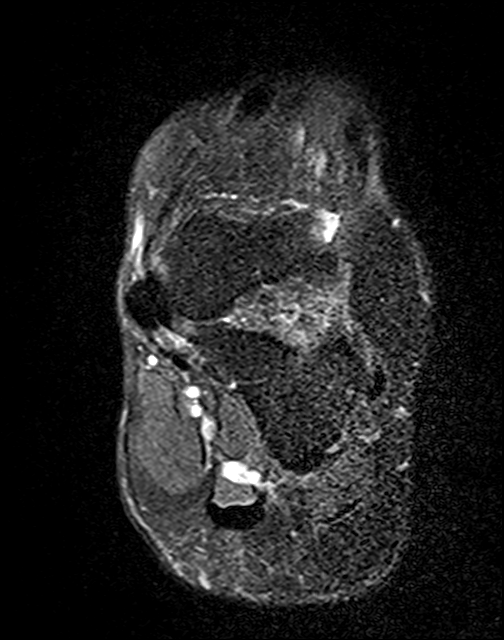

[Series 6: T2 fat-sat · axial · 3.0mm · 0.35mm/px · z∈[-135,-59]mm · 3 of 22 slices shown (2 of 2)]
[im 1/22]
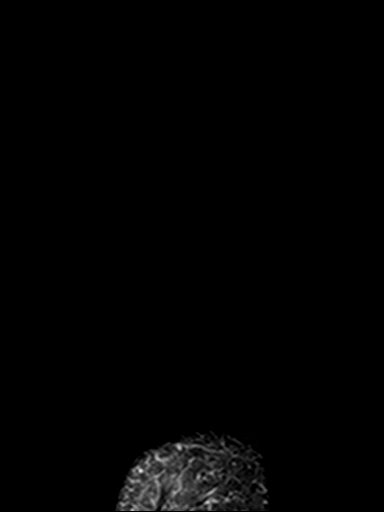
[im 11/22]
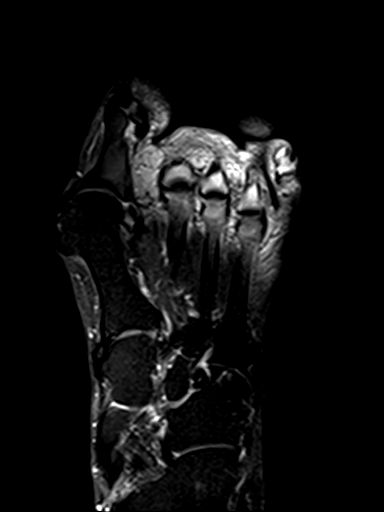
[im 22/22]
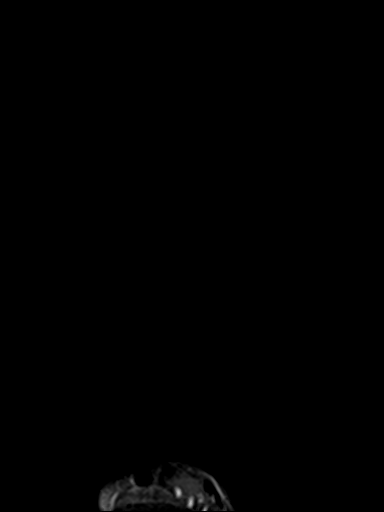

[Series 7: T1 · axial · 3.0mm · 0.35mm/px · z∈[-135,-59]mm · 3 of 22 slices shown (2 of 2)]
[im 1/22]
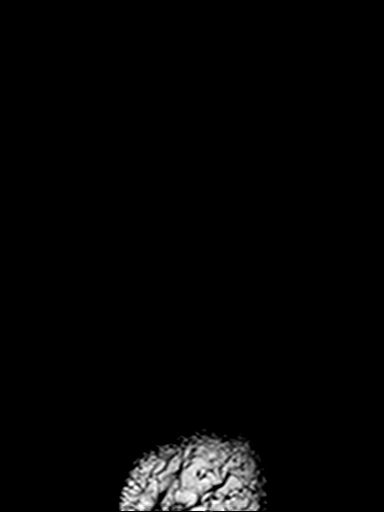
[im 11/22]
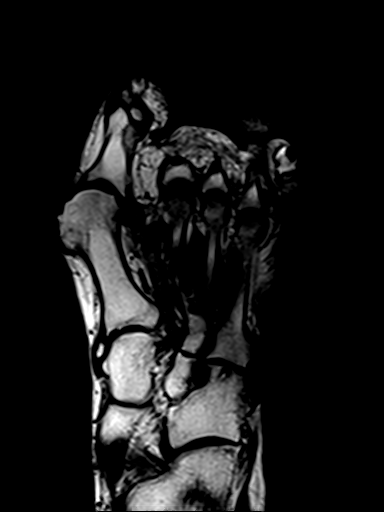
[im 22/22]
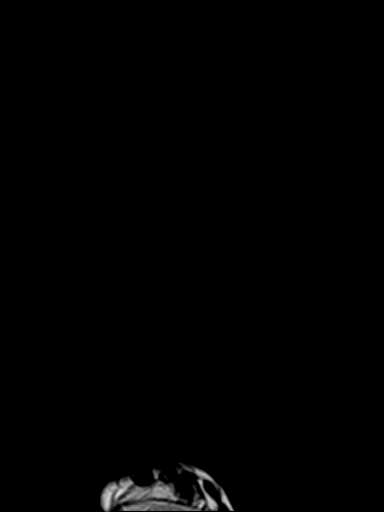

[Series 9: T1 fat-sat · coronal · non-contrast · 3.0mm · 0.51mm/px · 6 of 46 slices shown]
[im 1/46]
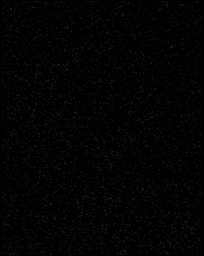
[im 10/46]
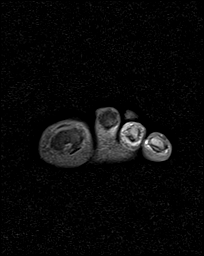
[im 19/46]
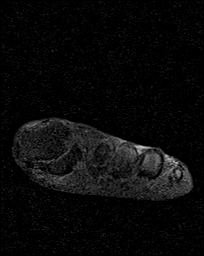
[im 28/46]
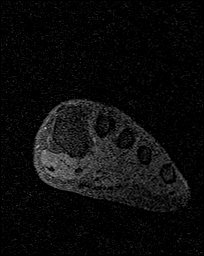
[im 37/46]
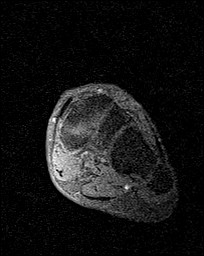
[im 46/46]
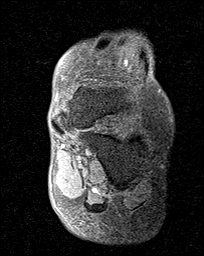

[Series 10: T1 fat-sat post-contrast · coronal · 3.0mm · 0.51mm/px · 3 of 47 slices shown]
[im 1/47]
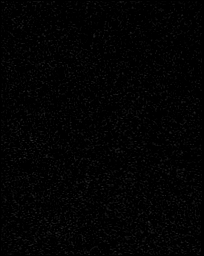
[im 10/47]
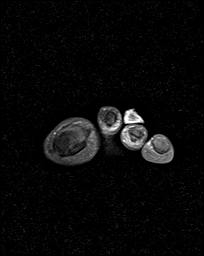
[im 19/47]
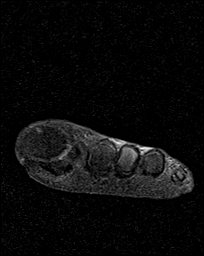

[26 of 40 positions shown; findings below may reference images not displayed]

FINDINGS: Bones/Joint/Cartilage

Hallux valgus with moderate first MTP degenerative change. There is
mild diffuse interphalangeal joint degenerative change. There is
moderate first TMT joint osteoarthritis.

Ligaments

Intact Lisfranc ligament.  Intact MTP collateral ligaments.

Muscles and Tendons

No acute tendon tear. No significant muscle edema or muscle atrophy.

Soft tissues

There is a T2 hyperintense/T1 hypointense, heterogeneously enhancing
mass along the dorsal aspect of the third toe, appearing to involve
the nail bed (series 10, image 41). This measures approximately
x 1.4 x 2.7 cm (series 10, image 42, sagittal STIR image 20). No
evidence of underlying bone involvement.
IMPRESSION: Heterogeneously enhancing mass along from the dorsal aspect of the
third toe, appearing to involve the nail bed, measuring 1.3 x 1.4 x
2.7 cm. No evidence of underlying bony involvement. MRI
characteristics are nonspecific. Recommend biopsy and/or excision
for definitive tissue characterization.

Hallux valgus with moderate first MTP osteoarthritis. Moderate first
TMT osteoarthritis.
